# Patient Record
Sex: Male | Born: 2000 | Race: White | Hispanic: Yes | Marital: Single
Health system: Southern US, Community
[De-identification: ages and names within clinical notes are randomized; demographics above are authoritative.]

## PROBLEM LIST (undated history)

## (undated) DIAGNOSIS — J45909 Unspecified asthma, uncomplicated: Secondary | ICD-10-CM

## (undated) DIAGNOSIS — Z9109 Other allergy status, other than to drugs and biological substances: Secondary | ICD-10-CM

---
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
## 2020-02-23 ENCOUNTER — Other Ambulatory Visit: Payer: BC Managed Care – PPO

## 2020-02-23 DIAGNOSIS — Z20822 Contact with and (suspected) exposure to covid-19: Secondary | ICD-10-CM | POA: Diagnosis not present

## 2020-02-27 LAB — NOVEL CORONAVIRUS, NAA: SARS-CoV-2, NAA: NOT DETECTED

## 2020-03-05 ENCOUNTER — Ambulatory Visit: Payer: BC Managed Care – PPO

## 2020-10-03 DIAGNOSIS — Z20822 Contact with and (suspected) exposure to covid-19: Secondary | ICD-10-CM | POA: Diagnosis not present

## 2020-10-10 DIAGNOSIS — Z20822 Contact with and (suspected) exposure to covid-19: Secondary | ICD-10-CM | POA: Diagnosis not present

## 2021-03-08 DIAGNOSIS — Z20822 Contact with and (suspected) exposure to covid-19: Secondary | ICD-10-CM | POA: Diagnosis not present

## 2021-04-14 DIAGNOSIS — Z0000 Encounter for general adult medical examination without abnormal findings: Secondary | ICD-10-CM | POA: Diagnosis not present

## 2021-04-14 DIAGNOSIS — Z131 Encounter for screening for diabetes mellitus: Secondary | ICD-10-CM | POA: Diagnosis not present

## 2021-04-14 DIAGNOSIS — Z13220 Encounter for screening for lipoid disorders: Secondary | ICD-10-CM | POA: Diagnosis not present

## 2021-04-30 DIAGNOSIS — F329 Major depressive disorder, single episode, unspecified: Secondary | ICD-10-CM | POA: Diagnosis not present

## 2021-07-10 DIAGNOSIS — Z8659 Personal history of other mental and behavioral disorders: Secondary | ICD-10-CM | POA: Diagnosis not present

## 2021-08-15 DIAGNOSIS — Z03818 Encounter for observation for suspected exposure to other biological agents ruled out: Secondary | ICD-10-CM | POA: Diagnosis not present

## 2021-08-15 DIAGNOSIS — J00 Acute nasopharyngitis [common cold]: Secondary | ICD-10-CM | POA: Diagnosis not present

## 2021-08-15 DIAGNOSIS — Z20822 Contact with and (suspected) exposure to covid-19: Secondary | ICD-10-CM | POA: Diagnosis not present
